# Patient Record
Sex: Male | Born: 1970 | Hispanic: No | Marital: Single | Smoking: Former smoker
Health system: Southern US, Community
[De-identification: ages and names within clinical notes are randomized; demographics above are authoritative.]

## PROBLEM LIST (undated history)

## (undated) DIAGNOSIS — G250 Essential tremor: Secondary | ICD-10-CM

## (undated) DIAGNOSIS — Z8489 Family history of other specified conditions: Secondary | ICD-10-CM

## (undated) DIAGNOSIS — R569 Unspecified convulsions: Secondary | ICD-10-CM

---

## 2012-02-19 DIAGNOSIS — G40309 Generalized idiopathic epilepsy and epileptic syndromes, not intractable, without status epilepticus: Secondary | ICD-10-CM | POA: Insufficient documentation

## 2013-02-17 DIAGNOSIS — R251 Tremor, unspecified: Secondary | ICD-10-CM | POA: Insufficient documentation

## 2014-08-20 DIAGNOSIS — G3184 Mild cognitive impairment, so stated: Secondary | ICD-10-CM | POA: Insufficient documentation

## 2018-04-18 DIAGNOSIS — M6530 Trigger finger, unspecified finger: Secondary | ICD-10-CM | POA: Insufficient documentation

## 2018-08-11 ENCOUNTER — Other Ambulatory Visit: Payer: Self-pay | Admitting: Orthopedic Surgery

## 2018-08-26 ENCOUNTER — Other Ambulatory Visit: Payer: Self-pay

## 2018-08-26 ENCOUNTER — Encounter
Admission: RE | Admit: 2018-08-26 | Discharge: 2018-08-26 | Disposition: A | Discharge status: Home / Self Care | Payer: PRIVATE HEALTH INSURANCE | Source: Ambulatory Visit | Attending: Orthopedic Surgery | Admitting: Orthopedic Surgery

## 2018-08-26 DIAGNOSIS — G250 Essential tremor: Secondary | ICD-10-CM | POA: Diagnosis not present

## 2018-08-26 DIAGNOSIS — Z01811 Encounter for preprocedural respiratory examination: Secondary | ICD-10-CM

## 2018-08-26 DIAGNOSIS — Z7951 Long term (current) use of inhaled steroids: Secondary | ICD-10-CM | POA: Diagnosis not present

## 2018-08-26 DIAGNOSIS — Z888 Allergy status to other drugs, medicaments and biological substances status: Secondary | ICD-10-CM | POA: Diagnosis not present

## 2018-08-26 DIAGNOSIS — G40909 Epilepsy, unspecified, not intractable, without status epilepticus: Secondary | ICD-10-CM | POA: Diagnosis not present

## 2018-08-26 DIAGNOSIS — M65842 Other synovitis and tenosynovitis, left hand: Secondary | ICD-10-CM | POA: Diagnosis not present

## 2018-08-26 DIAGNOSIS — Z79899 Other long term (current) drug therapy: Secondary | ICD-10-CM | POA: Diagnosis not present

## 2018-08-26 DIAGNOSIS — Z87891 Personal history of nicotine dependence: Secondary | ICD-10-CM | POA: Diagnosis not present

## 2018-08-26 HISTORY — DX: Family history of other specified conditions: Z84.89

## 2018-08-26 HISTORY — DX: Essential tremor: G25.0

## 2018-08-26 HISTORY — DX: Unspecified convulsions: R56.9

## 2018-08-26 MED ORDER — CEFAZOLIN SODIUM-DEXTROSE 2-4 GM/100ML-% IV SOLN: 2.0000 g | INTRAVENOUS | Status: DC

## 2018-08-26 NOTE — Patient Instructions (Signed)
Your procedure is scheduled on: 08/27/2018 Report to DAY SURGERY DEPARTMENT LOCATED ON 2ND FLOOR MEDICAL MALL ENTRANCE. To find out your arrival time please call [phone] between 1PM - 3PM on 08/26/2018 today.  Remember: Instructions that are not followed completely may result in serious medical risk, up to and including death, or upon the discretion of your surgeon and anesthesiologist your surgery may need to be rescheduled.     _X__ 1. Do not eat food after midnight the night before your procedure.                 No gum chewing or hard candies. You may drink clear liquids up to 2 hours                 before you are scheduled to arrive for your surgery- DO not drink clear                 liquids within 2 hours of the start of your surgery.                 Clear Liquids include:  water, apple juice without pulp, clear carbohydrate                 drink such as Clearfast or Gatorade, Black Coffee or Tea (Do not add                 anything to coffee or tea).  __X__2.  On the morning of surgery brush your teeth with toothpaste and water, you                 may rinse your mouth with mouthwash if you wish.  Do not swallow any              toothpaste of mouthwash.     _X__ 3.  No Alcohol for 24 hours before or after surgery.   _X__ 4.  Do Not Smoke or use e-cigarettes For 24 Hours Prior to Your Surgery.                 Do not use any chewable tobacco products for at least 6 hours prior to                 surgery.  ____  5.  Bring all medications with you on the day of surgery if instructed.   __X__  6.  Notify your doctor if there is any change in your medical condition      (cold, fever, infections).     Do not wear jewelry, make-up, hairpins, clips or nail polish. Do not wear lotions, powders, or perfumes.  Do not shave 48 hours prior to surgery. Men may shave face and neck. Do not bring valuables to the hospital.    [facility] belongings or  valuables.  Contacts, dentures/partials or body piercings may not be worn into surgery. Bring a case for your contacts, glasses or hearing aids, a denture cup will be supplied. Leave your suitcase in the car. After surgery it may be brought to your room. For patients admitted to the hospital, discharge time is determined by your treatment team.   Patients discharged the day of surgery will not be allowed to drive home.   Please read over the following fact sheets that you were given:   MRSA Information  __X__ Take these medicines the morning of surgery with A SIP OF WATER:    1.  lamoTRIgine (LAMICTAL)   2. levETIRAcetam (KEPPRA)   3. propranolol ER (INDERAL LA)   4.  5.  6.  ____ Fleet Enema (as directed)   __X__ Use CHG Soap/SAGE wipes as directed  ____ Use inhalers on the day of surgery  ____ Stop metformin/Janumet/Farxiga 2 days prior to surgery    ____ Take 1/2 of usual insulin dose the night before surgery. No insulin the morning          of surgery.   ____ Stop Blood Thinners Coumadin/Plavix/Xarelto/Pleta/Pradaxa/Eliquis/Effient/Aspirin  on   Or contact your Surgeon, Cardiologist or Medical Doctor regarding  ability to stop your blood thinners  __X__ Stop Anti-inflammatories 7 days before surgery such as Advil, Ibuprofen, Motrin,  BC or Goodies Powder, Naprosyn, Naproxen, Aleve, Aspirin TODAY  TYLENOL OK  __X__ Stop all herbal supplements, fish oil or vitamin E until after surgery.    ____ Bring C-Pap to the hospital.

## 2018-08-27 ENCOUNTER — Ambulatory Visit: Payer: PRIVATE HEALTH INSURANCE | Admitting: Anesthesiology

## 2018-08-27 ENCOUNTER — Encounter: Payer: Self-pay | Admitting: Orthopedic Surgery

## 2018-08-27 ENCOUNTER — Encounter
Admission: RE | Disposition: A | Discharge status: Home / Self Care | Payer: Self-pay | Source: Home / Self Care | Attending: Orthopedic Surgery

## 2018-08-27 ENCOUNTER — Ambulatory Visit
Admission: RE | Admit: 2018-08-27 | Discharge: 2018-08-27 | Disposition: A | Discharge status: Home / Self Care | Payer: PRIVATE HEALTH INSURANCE | Attending: Orthopedic Surgery | Admitting: Orthopedic Surgery

## 2018-08-27 ENCOUNTER — Other Ambulatory Visit: Payer: Self-pay

## 2018-08-27 DIAGNOSIS — Z79899 Other long term (current) drug therapy: Secondary | ICD-10-CM | POA: Insufficient documentation

## 2018-08-27 DIAGNOSIS — M65842 Other synovitis and tenosynovitis, left hand: Secondary | ICD-10-CM | POA: Insufficient documentation

## 2018-08-27 DIAGNOSIS — Z888 Allergy status to other drugs, medicaments and biological substances status: Secondary | ICD-10-CM | POA: Insufficient documentation

## 2018-08-27 DIAGNOSIS — Z7951 Long term (current) use of inhaled steroids: Secondary | ICD-10-CM | POA: Insufficient documentation

## 2018-08-27 DIAGNOSIS — Z87891 Personal history of nicotine dependence: Secondary | ICD-10-CM | POA: Insufficient documentation

## 2018-08-27 DIAGNOSIS — M6530 Trigger finger, unspecified finger: Secondary | ICD-10-CM

## 2018-08-27 DIAGNOSIS — G40909 Epilepsy, unspecified, not intractable, without status epilepticus: Secondary | ICD-10-CM | POA: Insufficient documentation

## 2018-08-27 DIAGNOSIS — G250 Essential tremor: Secondary | ICD-10-CM | POA: Insufficient documentation

## 2018-08-27 HISTORY — PX: TRIGGER FINGER RELEASE: SHX641

## 2018-08-27 SURGERY — RELEASE, A1 PULLEY, FOR TRIGGER FINGER
Anesthesia: General | Site: Hand | Laterality: Left

## 2018-08-27 MED ORDER — CHLORHEXIDINE GLUCONATE 4 % EX LIQD: 60.0000 mL | Freq: Once | CUTANEOUS | Status: DC

## 2018-08-27 MED ORDER — METOCLOPRAMIDE HCL 5 MG/ML IJ SOLN: 5.0000 mg | Freq: Three times a day (TID) | INTRAMUSCULAR | Status: DC | PRN

## 2018-08-27 MED ORDER — FAMOTIDINE 20 MG PO TABS
20.0000 mg | ORAL_TABLET | Freq: Once | ORAL | Status: AC
  Administered 2018-08-27: 20 mg via ORAL

## 2018-08-27 MED ORDER — FENTANYL CITRATE (PF) 100 MCG/2ML IJ SOLN: 25.0000 ug | INTRAMUSCULAR | Status: DC | PRN

## 2018-08-27 MED ORDER — PHENYLEPHRINE HCL 10 MG/ML IJ SOLN
INTRAMUSCULAR | Status: DC | PRN
  Administered 2018-08-27: 50 ug via INTRAVENOUS

## 2018-08-27 MED ORDER — PHENYLEPHRINE HCL 10 MG/ML IJ SOLN
INTRAMUSCULAR | Status: AC
  Filled 2018-08-27: qty 1

## 2018-08-27 MED ORDER — ONDANSETRON HCL 4 MG/2ML IJ SOLN: 4.0000 mg | Freq: Four times a day (QID) | INTRAMUSCULAR | Status: DC | PRN

## 2018-08-27 MED ORDER — MIDAZOLAM HCL 2 MG/2ML IJ SOLN
INTRAMUSCULAR | Status: AC
  Filled 2018-08-27: qty 2

## 2018-08-27 MED ORDER — MIDAZOLAM HCL 2 MG/2ML IJ SOLN
INTRAMUSCULAR | Status: DC | PRN
  Administered 2018-08-27: 2 mg via INTRAVENOUS

## 2018-08-27 MED ORDER — BUPIVACAINE HCL 0.25 % IJ SOLN
INTRAMUSCULAR | Status: DC | PRN
  Administered 2018-08-27: 10 mL

## 2018-08-27 MED ORDER — DEXAMETHASONE SODIUM PHOSPHATE 10 MG/ML IJ SOLN
INTRAMUSCULAR | Status: AC
  Filled 2018-08-27: qty 1

## 2018-08-27 MED ORDER — FENTANYL CITRATE (PF) 100 MCG/2ML IJ SOLN
INTRAMUSCULAR | Status: AC
  Filled 2018-08-27: qty 2

## 2018-08-27 MED ORDER — PROPOFOL 10 MG/ML IV BOLUS
INTRAVENOUS | Status: AC
  Filled 2018-08-27: qty 20

## 2018-08-27 MED ORDER — FENTANYL CITRATE (PF) 100 MCG/2ML IJ SOLN
INTRAMUSCULAR | Status: DC | PRN
  Administered 2018-08-27 (×4): 25 ug via INTRAVENOUS

## 2018-08-27 MED ORDER — ONDANSETRON HCL 4 MG PO TABS: 4.0000 mg | ORAL_TABLET | Freq: Four times a day (QID) | ORAL | Status: DC | PRN

## 2018-08-27 MED ORDER — LIDOCAINE HCL (CARDIAC) PF 100 MG/5ML IV SOSY
PREFILLED_SYRINGE | INTRAVENOUS | Status: DC | PRN
  Administered 2018-08-27: 60 mg via INTRAVENOUS

## 2018-08-27 MED ORDER — ONDANSETRON HCL 4 MG/2ML IJ SOLN
INTRAMUSCULAR | Status: AC
  Filled 2018-08-27: qty 2

## 2018-08-27 MED ORDER — PROPOFOL 10 MG/ML IV BOLUS
INTRAVENOUS | Status: DC | PRN
  Administered 2018-08-27: 170 mg via INTRAVENOUS

## 2018-08-27 MED ORDER — BUPIVACAINE HCL (PF) 0.25 % IJ SOLN
INTRAMUSCULAR | Status: AC
  Filled 2018-08-27: qty 30

## 2018-08-27 MED ORDER — HYDROCODONE-ACETAMINOPHEN 5-325 MG PO TABS: 1.0000 | ORAL_TABLET | ORAL | 0 refills | Status: AC | PRN

## 2018-08-27 MED ORDER — FAMOTIDINE 20 MG PO TABS
ORAL_TABLET | ORAL | Status: AC
  Administered 2018-08-27: 20 mg via ORAL
  Filled 2018-08-27: qty 1

## 2018-08-27 MED ORDER — DEXAMETHASONE SODIUM PHOSPHATE 10 MG/ML IJ SOLN
INTRAMUSCULAR | Status: DC | PRN
  Administered 2018-08-27: 5 mg via INTRAVENOUS

## 2018-08-27 MED ORDER — ACETAMINOPHEN 10 MG/ML IV SOLN
INTRAVENOUS | Status: AC
  Filled 2018-08-27: qty 100

## 2018-08-27 MED ORDER — PROMETHAZINE HCL 25 MG/ML IJ SOLN: 6.2500 mg | INTRAMUSCULAR | Status: DC | PRN

## 2018-08-27 MED ORDER — ACETAMINOPHEN 10 MG/ML IV SOLN
INTRAVENOUS | Status: DC | PRN
  Administered 2018-08-27: 1000 mg via INTRAVENOUS

## 2018-08-27 MED ORDER — LACTATED RINGERS IV SOLN
INTRAVENOUS | Status: DC
  Administered 2018-08-27 (×2): via INTRAVENOUS

## 2018-08-27 MED ORDER — METOCLOPRAMIDE HCL 10 MG PO TABS: 5.0000 mg | ORAL_TABLET | Freq: Three times a day (TID) | ORAL | Status: DC | PRN

## 2018-08-27 SURGICAL SUPPLY — 27 items
BNDG COHESIVE 4X5 TAN STRL (GAUZE/BANDAGES/DRESSINGS) ×2 IMPLANT
BNDG CONFORM 2 STRL LF (GAUZE/BANDAGES/DRESSINGS) ×2 IMPLANT
BNDG ESMARK 4X12 TAN STRL LF (GAUZE/BANDAGES/DRESSINGS) ×2 IMPLANT
COVER WAND RF STERILE (DRAPES) ×2 IMPLANT
CUFF TOURN 18 STER (MISCELLANEOUS) IMPLANT
DRSG DERMACEA 8X12 NADH (GAUZE/BANDAGES/DRESSINGS) ×2 IMPLANT
DURAPREP 26ML APPLICATOR (WOUND CARE) ×2 IMPLANT
ELECT REM PT RETURN 9FT ADLT (ELECTROSURGICAL) ×2
ELECTRODE REM PT RTRN 9FT ADLT (ELECTROSURGICAL) ×1 IMPLANT
GAUZE SPONGE 4X4 12PLY STRL (GAUZE/BANDAGES/DRESSINGS) ×2 IMPLANT
GLOVE BIO SURGEON STRL SZ8 (GLOVE) ×2 IMPLANT
GLOVE BIOGEL M STRL SZ7.5 (GLOVE) ×2 IMPLANT
GOWN STRL REUS W/ TWL LRG LVL3 (GOWN DISPOSABLE) ×2 IMPLANT
GOWN STRL REUS W/TWL LRG LVL3 (GOWN DISPOSABLE) ×4
KIT TURNOVER KIT A (KITS) ×2 IMPLANT
NDL HYPO 25X1 1.5 SAFETY (NEEDLE) ×1 IMPLANT
NEEDLE HYPO 25X1 1.5 SAFETY (NEEDLE) ×2 IMPLANT
NS IRRIG 500ML POUR BTL (IV SOLUTION) ×2 IMPLANT
PACK EXTREMITY ARMC (MISCELLANEOUS) ×2 IMPLANT
STOCKINETTE 48X4 2 PLY STRL (GAUZE/BANDAGES/DRESSINGS) ×1 IMPLANT
STOCKINETTE STRL 4IN 9604848 (GAUZE/BANDAGES/DRESSINGS) ×2 IMPLANT
SUT ETHILON 5-0 FS-2 18 BLK (SUTURE) ×2 IMPLANT
SUT PROLENE 4-0 (SUTURE) ×2
SUT PROLENE 4-0 RB1 30XMFL BLU (SUTURE) ×1
SUT PROLENE 6 0 CC 1 (SUTURE) ×2 IMPLANT
SUTURE PROLEN 4-0 RB1 30XMFL (SUTURE) ×1 IMPLANT
SYR 10ML LL (SYRINGE) ×4 IMPLANT

## 2018-08-27 NOTE — Anesthesia Post-op Follow-up Note (Signed)
Anesthesia QCDR form completed.        

## 2018-08-27 NOTE — Op Note (Signed)
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 M.D.  ANESTHESIA: general  ESTIMATED BLOOD LOSS: Minimal  FLUIDS REPLACED: 1000 mL of crystalloid  TOURNIQUET TIME: 16 minutes  DRAINS: None  INDICATIONS FOR SURGERY: Thomas Mcmahon is a 48 y.o. year old male with a history of triggering and locking of the left long finger. The patient had not seen any significant improvement despite conservative nonsurgical intervention. After discussion of the risks and benefits of surgical intervention, the patient expressed understanding of the risks benefits and agree with plans for trigger finger release.   PROCEDURE IN DETAIL: The patient was brought into the operating room and after adequate general anesthesia, a tourniquet was placed on the patient's left upper arm.The left hand and arm were prepped with alcohol and Duraprep and draped in the usual sterile fashion. A "time-out" was performed as per usual protocol. The hand and forearm were exsanguinated using an Esmarch and the tourniquet was inflated to 250 mmHg. Loupe magnification was used throughout the procedure.  A transverse incision was made at the proximal extent of the A1 pulley of the long finger. Blunt dissection was carried down to the tendon sheath and the edge of the A1 pulley was visualized. The A1 pulley was sharply incised using tenotomy scissors. Complete release of the A1 pulley was achieved. Smooth excursion of the flexor tendon was visualized. The wound was irrigated with copious amounts of fluid. The skin edges were reapproximated using #5-0 nylon. 0.25% Marcaine was injected along the incision site. The tourniquet was deflated after total tourniquet time of 16 minutes.  A sterile  dressing was applied.  The patient tolerated the procedure well and was transported to the recovery room in stable condition.  James P. Angie Fava M.D.

## 2018-08-27 NOTE — Anesthesia Procedure Notes (Signed)
Procedure Name: LMA Insertion Date/Time: 08/27/2018 4:49 PM Performed by: Lily Kocher, CRNA Pre-anesthesia Checklist: Patient identified, Patient being monitored, Timeout performed, Emergency Drugs available and Suction available Patient Re-evaluated:Patient Re-evaluated prior to induction Oxygen Delivery Method: Circle system utilized Preoxygenation: Pre-oxygenation with 100% oxygen Induction Type: IV induction Ventilation: Mask ventilation without difficulty LMA: LMA inserted LMA Size: 4.0 Tube type: Oral Number of attempts: 1 Placement Confirmation: positive ETCO2 and breath sounds checked- equal and bilateral Tube secured with: Tape Dental Injury: Teeth and Oropharynx as per pre-operative assessment

## 2018-08-27 NOTE — Anesthesia Postprocedure Evaluation (Signed)
Anesthesia Post Note  Patient: Thomas Mcmahon  Procedure(s) Performed: RELEASE TRIGGER FINGER/A-1 PULLEY Left Long Finger (Left Hand)  Patient location during evaluation: PACU Anesthesia Type: General Level of consciousness: awake and alert Pain management: pain level controlled Vital Signs Assessment: post-procedure vital signs reviewed and stable Respiratory status: spontaneous breathing and respiratory function stable Cardiovascular status: stable Anesthetic complications: no     Last Vitals:  Vitals:   08/27/18 1733 08/27/18 1736  BP:  112/79  Pulse:  66  Resp:  14  Temp: (!) 36.1 C   SpO2:  100%    Last Pain:  Vitals:   08/27/18 1733  TempSrc:   PainSc: 0-No pain                 KEPHART,WILLIAM K

## 2018-08-27 NOTE — Transfer of Care (Signed)
Immediate Anesthesia Transfer of Care Note  Patient: Thomas Mcmahon  Procedure(s) Performed: RELEASE TRIGGER FINGER/A-1 PULLEY Left Long Finger (Left Hand)  Patient Location: PACU  Anesthesia Type:General  Level of Consciousness: sedated  Airway & Oxygen Therapy: Patient Spontanous Breathing and Patient connected to face mask oxygen  Post-op Assessment: Report given to RN and Post -op Vital signs reviewed and stable  Post vital signs: Reviewed and stable  Last Vitals:  Vitals Value Taken Time  BP 112/79 08/27/2018  5:36 PM  Temp    Pulse 65 08/27/2018  5:37 PM  Resp 14 08/27/2018  5:37 PM  SpO2 100 % 08/27/2018  5:37 PM  Vitals shown include unvalidated device data.  Last Pain:  Vitals:   08/27/18 1404  TempSrc: Oral  PainSc: 0-No pain         Complications: No apparent anesthesia complications

## 2018-08-27 NOTE — Discharge Instructions (Signed)
AMBULATORY SURGERY  °DISCHARGE INSTRUCTIONS ° ° °1) The drugs that you were given will stay in your system until tomorrow so for the next 24 hours you should not: ° °A) Drive an automobile °B) Make any legal decisions °C) Drink any alcoholic beverage ° ° °2) You may resume regular meals tomorrow.  Today it is better to start with liquids and gradually work up to solid foods. ° °You may eat anything you prefer, but it is better to start with liquids, then soup and crackers, and gradually work up to solid foods. ° ° °3) Please notify your doctor immediately if you have any unusual bleeding, trouble breathing, redness and pain at the surgery site, drainage, fever, or pain not relieved by medication. °4)  ° °5) Your post-operative visit with Dr.                     °           °     is: Date:                        Time:   ° °Please call to schedule your post-operative visit. ° °6) Additional Instructions: ° ° ° ° ° °Instructions after Hand / Wrist Surgery ° ° James P. Hooten, Jr., M.D. ° Dept. of Orthopaedics & Sports Medicine ° [facility] ° [address] ° [place], [place]  [address] ° ° Phone: [phone]   Fax: [phone] ° ° °DIET: °• Drink plenty of non-alcoholic fluids & begin a light diet. °• Resume your normal diet the day after surgery. ° °ACTIVITY:  °• Keep the hand elevated above the level of the elbow. °• Begin gently moving the fingers on a regular basis to avoid stiffness. °• Avoid any heavy lifting, pushing, or pulling with the operative hand. °• Do not drive or operate any equipment until instructed. ° °WOUND CARE:  °• Keep the splint/bandage clean and dry.  °• The splint and stitches will be removed in the office. °• Continue to use the ice packs periodically to reduce pain and swelling. °• You may bathe or shower after the stitches are removed at the first office visit following surgery. ° °MEDICATIONS: °• You may resume your regular medications. °• Please take the pain medication as  prescribed. °• Do not take pain medication on an empty stomach. °• Do not drive or drink alcoholic beverages when taking pain medications. ° °CALL THE OFFICE FOR: °• Temperature above 101 degrees °• Excessive bleeding or drainage on the dressing. °• Excessive swelling, coldness, or paleness of the fingers. °• Persistent nausea and vomiting. ° °FOLLOW-UP:  °• You should have an appointment to return to the office in 7-10 days after surgery.  ° °REMEMBER: R.I.C.E. = Rest, Ice, Compression, Elevation !  °

## 2018-08-27 NOTE — Anesthesia Preprocedure Evaluation (Signed)
Anesthesia Evaluation  Patient identified by MRN, date of birth, ID band Patient awake    Reviewed: Allergy & Precautions, H&P , NPO status , Patient's Chart, lab work & pertinent test results, reviewed documented beta blocker date and time   History of Anesthesia Complications (+) Family history of anesthesia reaction and history of anesthetic complications  Airway Mallampati: III  TM Distance: >3 FB Neck ROM: full    Dental  (+) Dental Advidsory Given, Teeth Intact   Pulmonary neg pulmonary ROS, former smoker,           Cardiovascular Exercise Tolerance: Good negative cardio ROS       Neuro/Psych Seizures -, Well Controlled,  negative psych ROS   GI/Hepatic negative GI ROS, Neg liver ROS,   Endo/Other  negative endocrine ROS  Renal/GU negative Renal ROS  negative genitourinary   Musculoskeletal   Abdominal   Peds  Hematology negative hematology ROS (+)   Anesthesia Other Findings Past Medical History: No date: Essential tremor     Comment:   both hands takes propranolol No date: Family history of adverse reaction to anesthesia     Comment:  mother gets very nauseated No date: Seizures (HCC)   Reproductive/Obstetrics negative OB ROS                             Anesthesia Physical Anesthesia Plan  ASA: II  Anesthesia Plan: General   Post-op Pain Management:    Induction: Intravenous  PONV Risk Score and Plan: 2 and Ondansetron, Dexamethasone and Treatment may vary due to age or medical condition  Airway Management Planned: LMA  Additional Equipment:   Intra-op Plan:   Post-operative Plan: Extubation in OR  Informed Consent: I have reviewed the patients History and Physical, chart, labs and discussed the procedure including the risks, benefits and alternatives for the proposed anesthesia with the patient or authorized representative who has indicated his/her understanding  and acceptance.     Dental Advisory Given  Plan Discussed with: Anesthesiologist, CRNA and Surgeon  Anesthesia Plan Comments:         Anesthesia Quick Evaluation

## 2018-08-27 NOTE — H&P (Signed)
The patient has been re-examined, and the chart reviewed, and there have been no interval changes to the documented history and physical.    The risks, benefits, and alternatives have been discussed at length. The patient expressed understanding of the risks benefits and agreed with plans for surgical intervention.  Tobey Schmelzle P. Ovella Manygoats, Jr. M.D.    

## 2018-08-28 ENCOUNTER — Encounter: Payer: Self-pay | Admitting: Orthopedic Surgery

## 2018-09-02 ENCOUNTER — Encounter (HOSPITAL_BASED_OUTPATIENT_CLINIC_OR_DEPARTMENT_OTHER): Payer: Self-pay

## 2018-09-02 ENCOUNTER — Ambulatory Visit (HOSPITAL_BASED_OUTPATIENT_CLINIC_OR_DEPARTMENT_OTHER): Admit: 2018-09-02 | Payer: Self-pay | Admitting: Orthopedic Surgery

## 2018-09-02 SURGERY — RELEASE, A1 PULLEY, FOR TRIGGER FINGER
Anesthesia: Regional | Laterality: Left

## 2019-04-08 ENCOUNTER — Other Ambulatory Visit: Payer: Self-pay | Admitting: Student

## 2019-04-08 DIAGNOSIS — R222 Localized swelling, mass and lump, trunk: Secondary | ICD-10-CM

## 2019-04-13 ENCOUNTER — Other Ambulatory Visit: Payer: Self-pay

## 2019-04-13 ENCOUNTER — Ambulatory Visit
Admission: RE | Admit: 2019-04-13 | Discharge: 2019-04-13 | Disposition: A | Discharge status: Home / Self Care | Payer: PRIVATE HEALTH INSURANCE | Source: Ambulatory Visit | Attending: Student | Admitting: Student

## 2019-04-13 ENCOUNTER — Encounter (INDEPENDENT_AMBULATORY_CARE_PROVIDER_SITE_OTHER): Payer: Self-pay

## 2019-04-13 DIAGNOSIS — R222 Localized swelling, mass and lump, trunk: Secondary | ICD-10-CM | POA: Diagnosis present

## 2019-04-20 ENCOUNTER — Other Ambulatory Visit: Payer: Self-pay | Admitting: Surgery

## 2019-04-20 DIAGNOSIS — R222 Localized swelling, mass and lump, trunk: Secondary | ICD-10-CM

## 2019-04-30 ENCOUNTER — Other Ambulatory Visit: Payer: Self-pay

## 2019-04-30 ENCOUNTER — Ambulatory Visit
Admission: RE | Admit: 2019-04-30 | Discharge: 2019-04-30 | Disposition: A | Discharge status: Home / Self Care | Payer: PRIVATE HEALTH INSURANCE | Source: Ambulatory Visit | Attending: Surgery | Admitting: Surgery

## 2019-04-30 DIAGNOSIS — R222 Localized swelling, mass and lump, trunk: Secondary | ICD-10-CM | POA: Insufficient documentation

## 2019-04-30 MED ORDER — IOHEXOL 300 MG/ML  SOLN
75.0000 mL | Freq: Once | INTRAMUSCULAR | Status: AC | PRN
  Administered 2019-04-30: 75 mL via INTRAVENOUS

## 2020-06-20 IMAGING — US US SOFT TISSUE EXCLUDE HEAD/NECK
1 series · 12 of 12 positions shown · non-contrast
Comparison: None.

CLINICAL DATA: Lump on back.

EXAM:
ULTRASOUND OF HEAD/NECK SOFT TISSUES
TECHNIQUE: Ultrasound examination of the head and neck soft tissues was
performed in the area of clinical concern.

[Series 1: us soft tissue exclude head/neck · 0.07mm/px · 12 acquisitions, 12 frames shown]
[im 1/12]
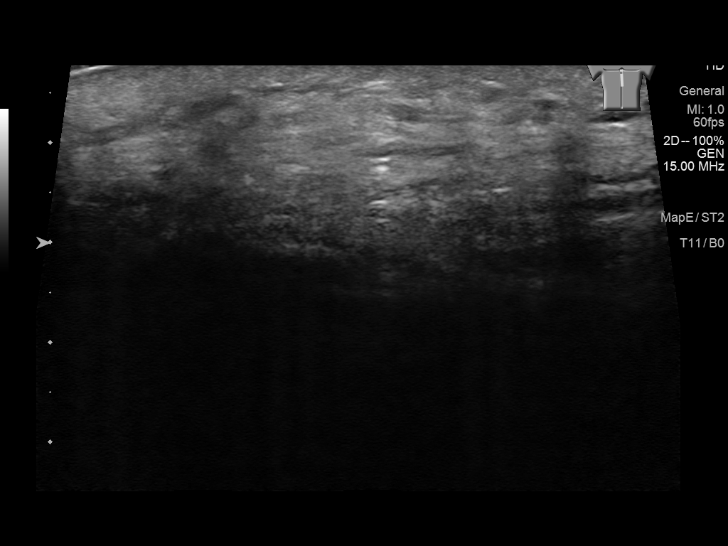
[im 2/12]
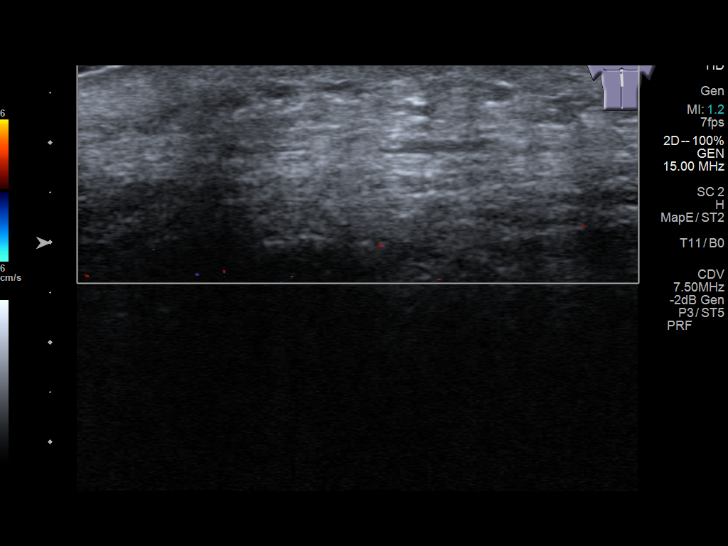
[im 3/12]
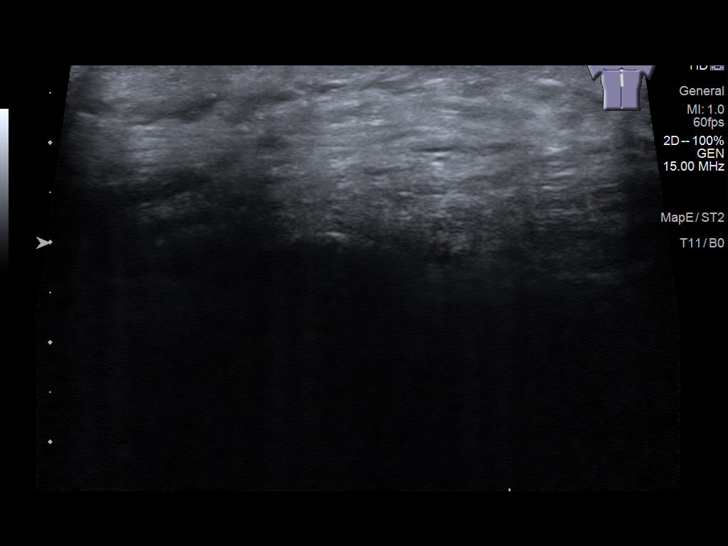
[im 4/12]
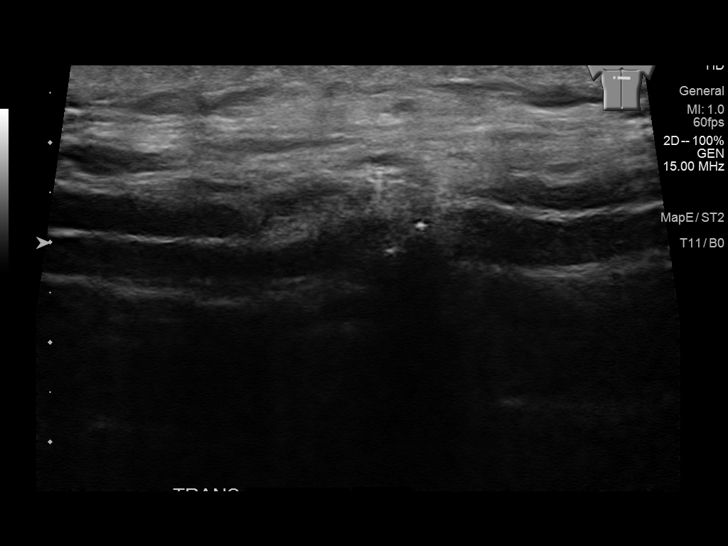
[im 5/12]
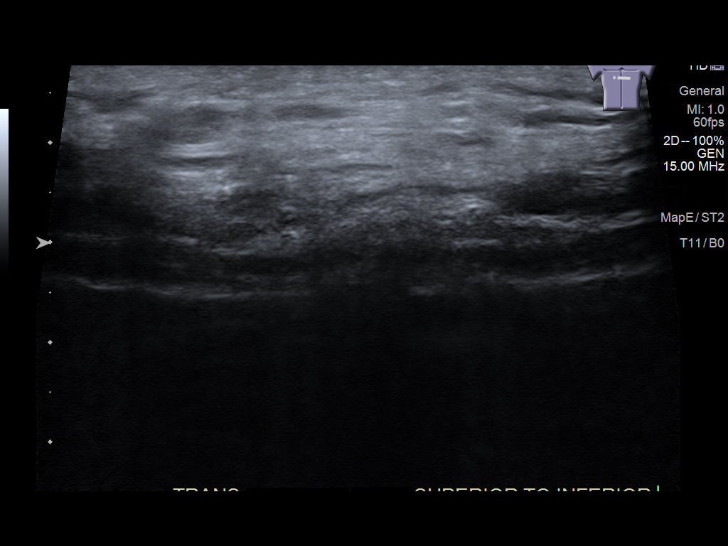
[im 6/12]
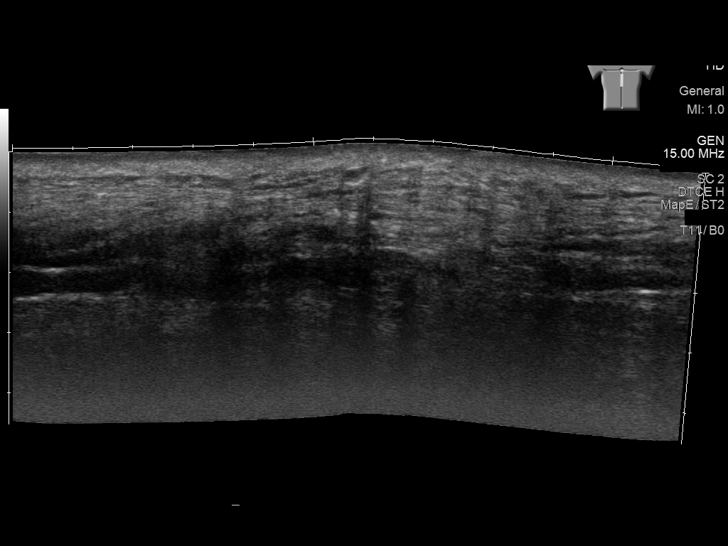
[im 7/12]
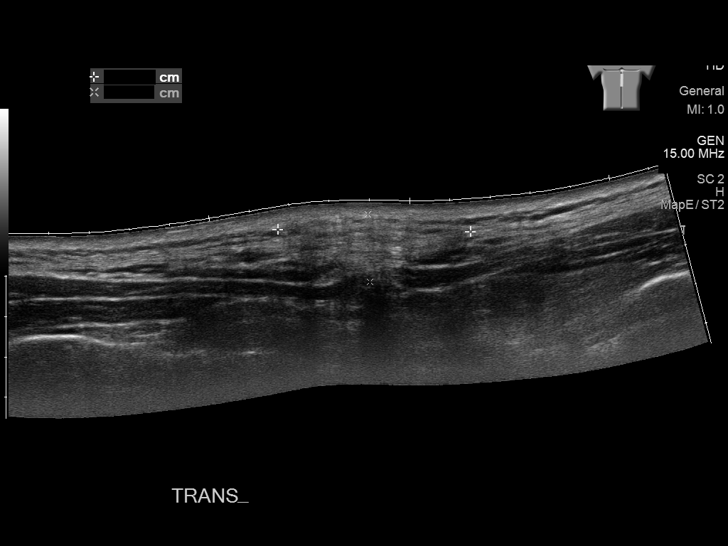
[im 8/12]
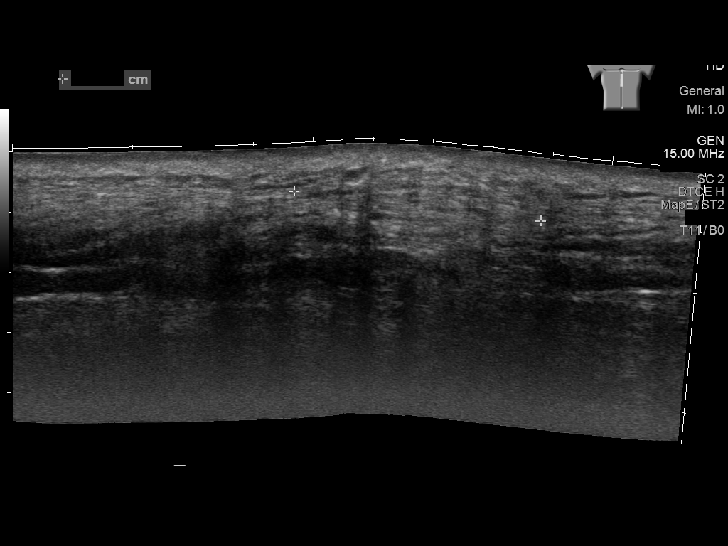
[im 9/12]
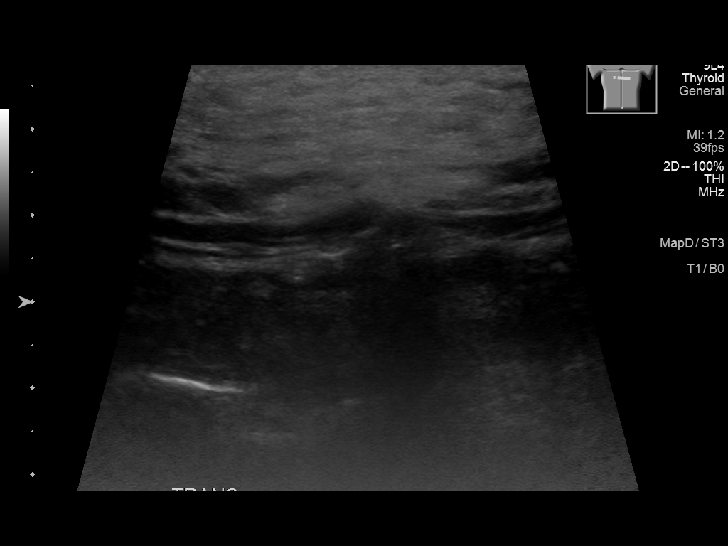
[im 10/12]
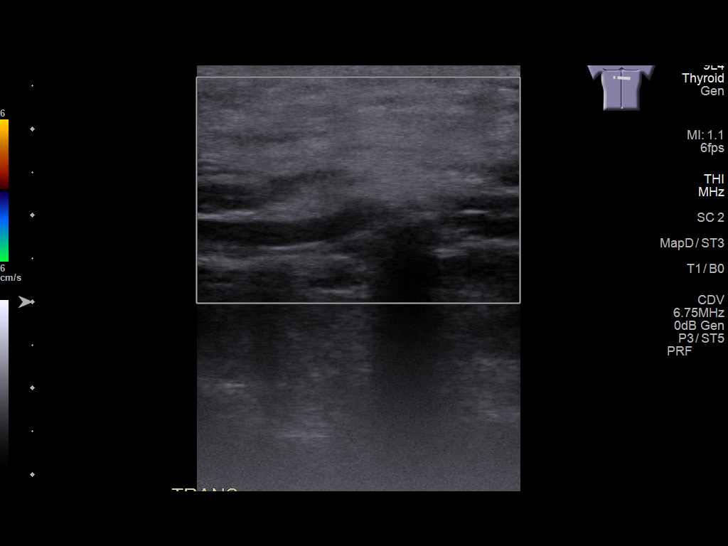
[im 11/12]
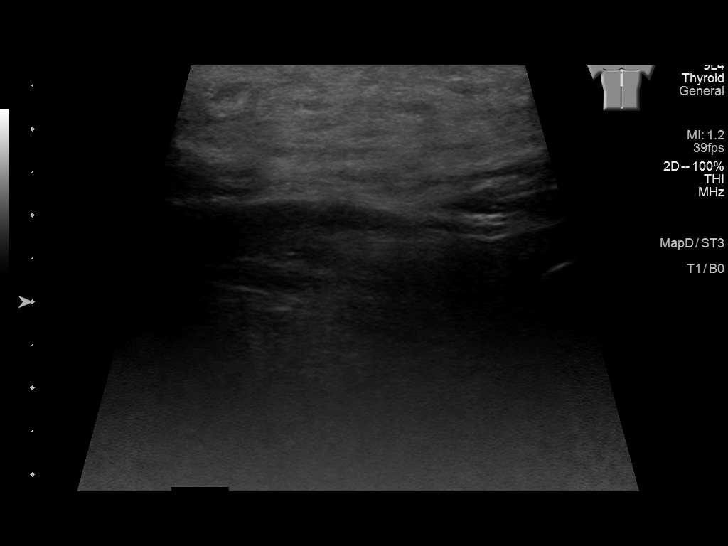
[im 12/12]
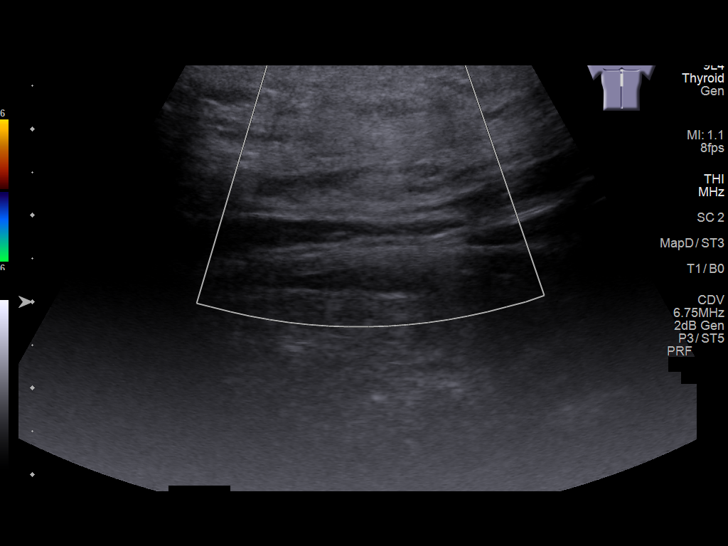

[12 of 12 positions shown; findings below may reference images not displayed]

FINDINGS: There is ill-defined isoechoic soft tissue thickening in the region
of the patient's lump measuring approximately 4.8 x 4.1 x 1.7 cm. No
other abnormalities.
IMPRESSION: The lump felt by the patient corresponds with ill-defined soft
tissue thickening measuring approximately 4.8 x 4.1 x 1.7 cm. This
finding is nonspecific but solid in appearance. This is not
definitively a lipoma and is definitely not a cyst. This finding
could be further and more definitively evaluated with either an MRI
or ultrasound-guided biopsy.

## 2021-09-16 ENCOUNTER — Other Ambulatory Visit: Payer: Self-pay

## 2021-09-16 ENCOUNTER — Emergency Department
Admission: EM | Admit: 2021-09-16 | Discharge: 2021-09-16 | Disposition: A | Discharge status: Home / Self Care | Payer: 59 | Attending: Emergency Medicine | Admitting: Emergency Medicine

## 2021-09-16 DIAGNOSIS — Z23 Encounter for immunization: Secondary | ICD-10-CM | POA: Diagnosis not present

## 2021-09-16 DIAGNOSIS — W228XXA Striking against or struck by other objects, initial encounter: Secondary | ICD-10-CM | POA: Insufficient documentation

## 2021-09-16 DIAGNOSIS — S0990XA Unspecified injury of head, initial encounter: Secondary | ICD-10-CM

## 2021-09-16 DIAGNOSIS — S0101XA Laceration without foreign body of scalp, initial encounter: Secondary | ICD-10-CM

## 2021-09-16 MED ORDER — BACITRACIN-NEOMYCIN-POLYMYXIN 400-5-5000 EX OINT
TOPICAL_OINTMENT | Freq: Once | CUTANEOUS | Status: AC
  Administered 2021-09-16: 1 via TOPICAL
  Filled 2021-09-16: qty 1

## 2021-09-16 MED ORDER — LIDOCAINE-EPINEPHRINE-TETRACAINE (LET) TOPICAL GEL
3.0000 mL | Freq: Once | TOPICAL | Status: AC
  Administered 2021-09-16: 3 mL via TOPICAL
  Filled 2021-09-16: qty 3

## 2021-09-16 MED ORDER — TETANUS-DIPHTH-ACELL PERTUSSIS 5-2.5-18.5 LF-MCG/0.5 IM SUSY
0.5000 mL | PREFILLED_SYRINGE | Freq: Once | INTRAMUSCULAR | Status: AC
  Administered 2021-09-16: 0.5 mL via INTRAMUSCULAR
  Filled 2021-09-16: qty 0.5

## 2021-09-16 NOTE — Discharge Instructions (Signed)
Keep the area as dry as possible.  You will not need to apply Neosporin or Vaseline.  Leave the area dry.  We will heal much easier.  Wait until tomorrow to wash her hair.  Be very careful around the staples to not get a comb or brush stuck in them as it will pull.  Have the sutures removed in 10 days by your regular doctor, the urgent care, or the emergency department. ?

## 2021-09-16 NOTE — ED Notes (Signed)
Pt has small lac to top of head on L side. Bleeding controlled at this time.Unknown date of last Tdap.  ?

## 2021-09-16 NOTE — ED Notes (Signed)
D/c instructions reviewed with pt & family. All questions answered. Pt stable at d/c. ?

## 2021-09-16 NOTE — ED Triage Notes (Signed)
Pt states he hit his head on a clothes line and got a gash on the top of his head from a screw- bleeding controlled at this time- small laceration noted to the top of the head on the L side ?

## 2021-09-16 NOTE — ED Provider Notes (Signed)
? ?[facility] ?Provider Note ? ? ? Event Date/Time  ? First MD Initiated Contact with Patient 09/16/21 1703   ?  (approximate) ? ? ?History  ? ?Laceration ? ? ?HPI ? ?Thomas Mcmahon is a 51 y.o. male presents emergency department with laceration to the scalp.  Hit his head on a close line.  Extended onto a screw that was hanging out.  No LOC.  No headache.  Patient does has history of seizures but no seizure recently.  Unsure of his last Tdap. ? ?  ? ? ?Physical Exam  ? ?Triage Vital Signs: ?ED Triage Vitals  ?Enc Vitals Group  ?   BP 09/16/21 1652 139/71  ?   Pulse Rate 09/16/21 1652 76  ?   Resp 09/16/21 1652 18  ?   Temp 09/16/21 1652 98.2 ?F (36.8 ?C)  ?   Temp Source 09/16/21 1652 Oral  ?   SpO2 09/16/21 1652 100 %  ?   Weight 09/16/21 1650 214 lb (97.1 kg)  ?   Height 09/16/21 1650 5\' 10"  (1.778 m)  ?   Head Circumference --   ?   Peak Flow --   ?   Pain Score 09/16/21 1649 3  ?   Pain Loc --   ?   Pain Edu? --   ?   Excl. in [place]? --   ? ? ?Most recent vital signs: ?Vitals:  ? 09/16/21 1652  ?BP: 139/71  ?Pulse: 76  ?Resp: 18  ?Temp: 98.2 ?F (36.8 ?C)  ?SpO2: 100%  ? ? ? ?General: Awake, no distress.   ?CV:  Good peripheral perfusion. regular rate and  rhythm ?Resp:  Normal effort.  ?Abd:  No distention.   ?Other:  2 cm laceration noted to the top of the scalp, no foreign body noted, area is superficial. ? ? ?ED Results / Procedures / Treatments  ? ?Labs ?(all labs ordered are listed, but only abnormal results are displayed) ?Labs Reviewed - No data to display ? ? ?EKG ? ? ? ? ?RADIOLOGY ? ? ? ? ?PROCEDURES: ? ? ?Marland Kitchen.Laceration Repair ? ?Date/Time: 09/16/2021 5:41 PM ?Performed by: Versie Starks, PA-C ?Authorized by: Versie Starks, PA-C  ? ?Consent:  ?  Consent obtained:  Verbal ?  Consent given by:  Patient ?  Risks, benefits, and alternatives were discussed: yes   ?  Risks discussed:  Infection, pain, retained foreign body, poor cosmetic result, need for additional repair, nerve  damage, poor wound healing and vascular damage ?  Alternatives discussed:  No treatment ?Universal protocol:  ?  Procedure explained and questions answered to patient or proxy's satisfaction: yes   ?  Immediately prior to procedure, a time out was called: yes   ?  Patient identity confirmed:  Verbally with patient ?Anesthesia:  ?  Anesthesia method:  Topical application ?  Topical anesthetic:  LET ?Laceration details:  ?  Location:  Scalp ?  Length (cm):  2 ?Exploration:  ?  Hemostasis achieved with:  LET ?  Imaging outcome: foreign body not noted   ?  Wound exploration: wound explored through full range of motion   ?  Wound extent: no areolar tissue violation noted, no fascia violation noted, no foreign bodies/material noted, no muscle damage noted, no nerve damage noted, no underlying fracture noted and no vascular damage noted   ?  Contaminated: no   ?Treatment:  ?  Area cleansed with:  Saline ?  Amount of cleaning:  Standard ?  Irrigation solution:  Sterile saline ?  Irrigation method:  Tap ?Skin repair:  ?  Repair method:  Staples ?  Number of staples:  3 ?Approximation:  ?  Approximation:  Close ?Repair type:  ?  Repair type:  Simple ?Post-procedure details:  ?  Dressing:  Antibiotic ointment ?  Procedure completion:  Tolerated well, no immediate complications ? ? ?MEDICATIONS ORDERED IN ED: ?Medications  ?Tdap (BOOSTRIX) injection 0.5 mL (has no administration in time range)  ?lidocaine-EPINEPHrine-tetracaine (LET) topical gel (has no administration in time range)  ?neomycin-bacitracin-polymyxin (NEOSPORIN) ointment packet (has no administration in time range)  ? ? ? ?IMPRESSION / MDM / ASSESSMENT AND PLAN / ED COURSE  ?I reviewed the triage vital signs and the nursing notes. ?             ?               ? ?Differential diagnosis includes, but is not limited to, contusion, laceration, minor head injury ? ?Patient does not need to have a CT as he had no LOC, no headache, no seizure-like activity.  See  procedure note for laceration repair.  His Tdap was updated in the ED.  He was discharged stable condition with suture/staple instructions.  He is to have staples removed in 10 days by his family physician, urgent care or emergency department.   ? ? ? ?  ? ? ?FINAL CLINICAL IMPRESSION(S) / ED DIAGNOSES  ? ?Final diagnoses:  ?Scalp laceration, initial encounter  ?Minor head injury, initial encounter  ? ? ? ?Rx / DC Orders  ? ?ED Discharge Orders   ? ? None  ? ?  ? ? ? ?Note:  This document was prepared using Dragon voice recognition software and may include unintentional dictation errors. ? ?  ?Versie Starks, PA-C ?09/16/21 1743 ? ?  ?Duffy Bruce, MD ?09/16/21 2205 ? ?

## 2022-05-31 DIAGNOSIS — H35052 Retinal neovascularization, unspecified, left eye: Secondary | ICD-10-CM | POA: Diagnosis not present

## 2022-07-02 DIAGNOSIS — H25041 Posterior subcapsular polar age-related cataract, right eye: Secondary | ICD-10-CM | POA: Diagnosis not present

## 2024-02-19 ENCOUNTER — Encounter

## 2024-02-19 ENCOUNTER — Ambulatory Visit

## 2024-02-19 DIAGNOSIS — D122 Benign neoplasm of ascending colon: Secondary | ICD-10-CM | POA: Diagnosis not present

## 2024-02-19 DIAGNOSIS — D128 Benign neoplasm of rectum: Secondary | ICD-10-CM | POA: Diagnosis not present

## 2024-02-19 DIAGNOSIS — D123 Benign neoplasm of transverse colon: Secondary | ICD-10-CM | POA: Diagnosis not present

## 2024-02-19 DIAGNOSIS — Z1211 Encounter for screening for malignant neoplasm of colon: Secondary | ICD-10-CM | POA: Diagnosis not present

## 2024-02-19 DIAGNOSIS — K635 Polyp of colon: Secondary | ICD-10-CM | POA: Diagnosis not present
